# Patient Record
Sex: Female | Born: 1945 | Race: White | Hispanic: No | Smoking: Former smoker
Health system: Southern US, Community
[De-identification: ages and names within clinical notes are randomized; demographics above are authoritative.]

## PROBLEM LIST (undated history)

## (undated) DIAGNOSIS — F329 Major depressive disorder, single episode, unspecified: Secondary | ICD-10-CM

## (undated) DIAGNOSIS — J449 Chronic obstructive pulmonary disease, unspecified: Secondary | ICD-10-CM

## (undated) DIAGNOSIS — D649 Anemia, unspecified: Secondary | ICD-10-CM

## (undated) DIAGNOSIS — F32A Depression, unspecified: Secondary | ICD-10-CM

## (undated) DIAGNOSIS — L0390 Cellulitis, unspecified: Secondary | ICD-10-CM

## (undated) DIAGNOSIS — R768 Other specified abnormal immunological findings in serum: Secondary | ICD-10-CM

## (undated) DIAGNOSIS — G3184 Mild cognitive impairment, so stated: Secondary | ICD-10-CM

## (undated) DIAGNOSIS — I10 Essential (primary) hypertension: Secondary | ICD-10-CM

## (undated) DIAGNOSIS — R5383 Other fatigue: Secondary | ICD-10-CM

## (undated) DIAGNOSIS — F419 Anxiety disorder, unspecified: Secondary | ICD-10-CM

## (undated) DIAGNOSIS — K219 Gastro-esophageal reflux disease without esophagitis: Secondary | ICD-10-CM

## (undated) HISTORY — DX: Other specified abnormal immunological findings in serum: R76.8

## (undated) HISTORY — DX: Depression, unspecified: F32.A

## (undated) HISTORY — DX: Chronic obstructive pulmonary disease, unspecified: J44.9

## (undated) HISTORY — DX: Mild cognitive impairment of uncertain or unknown etiology: G31.84

## (undated) HISTORY — DX: Anxiety disorder, unspecified: F41.9

## (undated) HISTORY — DX: Other fatigue: R53.83

## (undated) HISTORY — DX: Anemia, unspecified: D64.9

## (undated) HISTORY — DX: Gastro-esophageal reflux disease without esophagitis: K21.9

## (undated) HISTORY — DX: Major depressive disorder, single episode, unspecified: F32.9

## (undated) HISTORY — DX: Cellulitis, unspecified: L03.90

## (undated) HISTORY — DX: Essential (primary) hypertension: I10

---

## 1998-10-14 ENCOUNTER — Emergency Department (HOSPITAL_COMMUNITY): Admission: EM | Admit: 1998-10-14 | Discharge: 1998-10-14 | Payer: Self-pay | Admitting: Emergency Medicine

## 2005-01-23 ENCOUNTER — Emergency Department: Payer: Self-pay | Admitting: Internal Medicine

## 2006-04-10 ENCOUNTER — Ambulatory Visit: Payer: Self-pay | Admitting: Otolaryngology

## 2012-05-10 ENCOUNTER — Emergency Department: Payer: Self-pay | Admitting: Emergency Medicine

## 2012-05-10 LAB — BASIC METABOLIC PANEL
Chloride: 109 mmol/L — ABNORMAL HIGH (ref 98–107)
Creatinine: 0.72 mg/dL (ref 0.60–1.30)
Osmolality: 283 (ref 275–301)
Potassium: 3.7 mmol/L (ref 3.5–5.1)

## 2012-05-10 LAB — CBC
MCH: 30 pg (ref 26.0–34.0)
MCHC: 34 g/dL (ref 32.0–36.0)
MCV: 88 fL (ref 80–100)
Platelet: 254 10*3/uL (ref 150–440)
RBC: 4.49 10*6/uL (ref 3.80–5.20)

## 2012-05-10 LAB — PRO B NATRIURETIC PEPTIDE: B-Type Natriuretic Peptide: 111 pg/mL (ref 0–125)

## 2012-05-10 LAB — TROPONIN I: Troponin-I: <0.02 ng/mL

## 2012-11-20 ENCOUNTER — Emergency Department: Payer: Self-pay | Admitting: Unknown Physician Specialty

## 2012-11-20 LAB — COMPREHENSIVE METABOLIC PANEL
Anion Gap: 8 (ref 7–16)
Bilirubin,Total: 0.8 mg/dL (ref 0.2–1.0)
Calcium, Total: 8.8 mg/dL (ref 8.5–10.1)
Chloride: 105 mmol/L (ref 98–107)
Creatinine: 0.81 mg/dL (ref 0.60–1.30)
EGFR (African American): >60
EGFR (Non-African Amer.): >60
Glucose: 92 mg/dL (ref 65–99)
Osmolality: 280 (ref 275–301)
SGPT (ALT): 10 U/L — ABNORMAL LOW (ref 12–78)
Sodium: 141 mmol/L (ref 136–145)
Total Protein: 7.2 g/dL (ref 6.4–8.2)

## 2012-11-20 LAB — CBC
HCT: 38.8 % (ref 35.0–47.0)
HGB: 13.4 g/dL (ref 12.0–16.0)
MCH: 30.4 pg (ref 26.0–34.0)
MCHC: 34.6 g/dL (ref 32.0–36.0)
MCV: 88 fL (ref 80–100)
Platelet: 243 10*3/uL (ref 150–440)
RBC: 4.42 10*6/uL (ref 3.80–5.20)

## 2012-11-20 LAB — CK TOTAL AND CKMB (NOT AT ARMC)
CK, Total: 63 U/L (ref 21–215)
CK-MB: <0.5 ng/mL — ABNORMAL LOW (ref 0.5–3.6)

## 2012-11-20 LAB — TROPONIN I: Troponin-I: <0.02 ng/mL

## 2013-02-11 ENCOUNTER — Emergency Department: Payer: Self-pay | Admitting: Emergency Medicine

## 2013-02-11 LAB — CBC
HCT: 39.7 % (ref 35.0–47.0)
HGB: 13.6 g/dL (ref 12.0–16.0)
MCH: 30.1 pg (ref 26.0–34.0)
MCHC: 34.3 g/dL (ref 32.0–36.0)
MCV: 88 fL (ref 80–100)
RBC: 4.53 10*6/uL (ref 3.80–5.20)
RDW: 13.5 % (ref 11.5–14.5)

## 2013-02-11 LAB — BASIC METABOLIC PANEL
Anion Gap: 9 (ref 7–16)
BUN: 15 mg/dL (ref 7–18)
Calcium, Total: 9 mg/dL (ref 8.5–10.1)
Chloride: 109 mmol/L — ABNORMAL HIGH (ref 98–107)
Co2: 24 mmol/L (ref 21–32)
Creatinine: 0.64 mg/dL (ref 0.60–1.30)
EGFR (Non-African Amer.): >60

## 2013-05-29 ENCOUNTER — Emergency Department: Payer: Self-pay | Admitting: Emergency Medicine

## 2013-05-29 LAB — BASIC METABOLIC PANEL
Anion Gap: 7 (ref 7–16)
BUN: 12 mg/dL (ref 7–18)
Calcium, Total: 9.3 mg/dL (ref 8.5–10.1)
Chloride: 108 mmol/L — ABNORMAL HIGH (ref 98–107)
Creatinine: 0.83 mg/dL (ref 0.60–1.30)
Glucose: 88 mg/dL (ref 65–99)

## 2013-05-29 LAB — CBC
HCT: 40.6 % (ref 35.0–47.0)
MCV: 87 fL (ref 80–100)
RDW: 13.7 % (ref 11.5–14.5)

## 2013-07-03 ENCOUNTER — Emergency Department: Payer: Self-pay | Admitting: Emergency Medicine

## 2013-07-03 LAB — COMPREHENSIVE METABOLIC PANEL
Albumin: 3.6 g/dL (ref 3.4–5.0)
BUN: 17 mg/dL (ref 7–18)
Bilirubin,Total: 0.5 mg/dL (ref 0.2–1.0)
Chloride: 108 mmol/L — ABNORMAL HIGH (ref 98–107)
Creatinine: 0.81 mg/dL (ref 0.60–1.30)
SGOT(AST): 19 U/L (ref 15–37)
SGPT (ALT): 13 U/L (ref 12–78)
Sodium: 142 mmol/L (ref 136–145)

## 2013-07-03 LAB — CK TOTAL AND CKMB (NOT AT ARMC): CK-MB: <0.5 ng/mL — ABNORMAL LOW (ref 0.5–3.6)

## 2013-07-03 LAB — CBC: RBC: 4.33 10*6/uL (ref 3.80–5.20)

## 2013-07-21 ENCOUNTER — Emergency Department: Payer: Self-pay | Admitting: Emergency Medicine

## 2013-07-21 LAB — BASIC METABOLIC PANEL
Anion Gap: 3 — ABNORMAL LOW (ref 7–16)
Calcium, Total: 9.3 mg/dL (ref 8.5–10.1)
Chloride: 106 mmol/L (ref 98–107)
Co2: 31 mmol/L (ref 21–32)
Creatinine: 0.71 mg/dL (ref 0.60–1.30)
EGFR (Non-African Amer.): >60
Potassium: 3.4 mmol/L — ABNORMAL LOW (ref 3.5–5.1)
Sodium: 140 mmol/L (ref 136–145)

## 2013-07-21 LAB — CBC
MCH: 29.8 pg (ref 26.0–34.0)
MCV: 86 fL (ref 80–100)
Platelet: 225 10*3/uL (ref 150–440)

## 2013-10-22 ENCOUNTER — Emergency Department: Payer: Self-pay | Admitting: Emergency Medicine

## 2013-11-27 ENCOUNTER — Emergency Department: Payer: Self-pay | Admitting: Emergency Medicine

## 2013-11-27 LAB — CBC WITH DIFFERENTIAL/PLATELET
Basophil #: 0.1 10*3/uL (ref 0.0–0.1)
Basophil %: 0.5 %
Eosinophil #: 0.2 10*3/uL (ref 0.0–0.7)
Eosinophil %: 1.8 %
HGB: 12.7 g/dL (ref 12.0–16.0)
Lymphocyte %: 11.1 %
MCH: 29.7 pg (ref 26.0–34.0)
Monocyte #: 1 x10 3/mm — ABNORMAL HIGH (ref 0.2–0.9)
Monocyte %: 8.1 %
Neutrophil #: 9.8 10*3/uL — ABNORMAL HIGH (ref 1.4–6.5)
Platelet: 252 10*3/uL (ref 150–440)

## 2013-11-27 LAB — COMPREHENSIVE METABOLIC PANEL
Anion Gap: 7 (ref 7–16)
Calcium, Total: 9.2 mg/dL (ref 8.5–10.1)
Osmolality: 278 (ref 275–301)
Potassium: 4 mmol/L (ref 3.5–5.1)
SGOT(AST): 28 U/L (ref 15–37)
SGPT (ALT): 22 U/L (ref 12–78)
Sodium: 139 mmol/L (ref 136–145)
Total Protein: 7 g/dL (ref 6.4–8.2)

## 2013-11-27 LAB — TROPONIN I: Troponin-I: <0.02 ng/mL

## 2013-12-09 ENCOUNTER — Emergency Department: Payer: Self-pay | Admitting: Emergency Medicine

## 2013-12-09 LAB — URINALYSIS, COMPLETE
Glucose,UR: NEGATIVE mg/dL (ref 0–75)
Ph: 8 (ref 4.5–8.0)
Protein: NEGATIVE
Specific Gravity: 1.014 (ref 1.003–1.030)
Squamous Epithelial: 5
WBC UR: 3 /HPF (ref 0–5)

## 2013-12-09 LAB — CK TOTAL AND CKMB (NOT AT ARMC)
CK, Total: 43 U/L (ref 21–215)
CK-MB: 0.5 ng/mL (ref 0.5–3.6)

## 2013-12-09 LAB — COMPREHENSIVE METABOLIC PANEL
Albumin: 3.7 g/dL (ref 3.4–5.0)
Alkaline Phosphatase: 94 U/L
Anion Gap: 6 — ABNORMAL LOW (ref 7–16)
BUN: 16 mg/dL (ref 7–18)
Bilirubin,Total: 1 mg/dL (ref 0.2–1.0)
Calcium, Total: 9 mg/dL (ref 8.5–10.1)
Chloride: 110 mmol/L — ABNORMAL HIGH (ref 98–107)
Co2: 25 mmol/L (ref 21–32)
Glucose: 89 mg/dL (ref 65–99)
Potassium: 3.2 mmol/L — ABNORMAL LOW (ref 3.5–5.1)
Total Protein: 6.8 g/dL (ref 6.4–8.2)

## 2013-12-09 LAB — CBC
MCHC: 32.9 g/dL (ref 32.0–36.0)
MCV: 88 fL (ref 80–100)
Platelet: 283 10*3/uL (ref 150–440)

## 2014-02-09 ENCOUNTER — Emergency Department: Payer: Self-pay | Admitting: Emergency Medicine

## 2014-02-09 LAB — CBC
HCT: 35.6 % (ref 35.0–47.0)
HGB: 11.9 g/dL — ABNORMAL LOW (ref 12.0–16.0)
MCH: 30.1 pg (ref 26.0–34.0)
MCHC: 33.4 g/dL (ref 32.0–36.0)
MCV: 90 fL (ref 80–100)
PLATELETS: 185 10*3/uL (ref 150–440)
RBC: 3.95 10*6/uL (ref 3.80–5.20)
RDW: 13.6 % (ref 11.5–14.5)
WBC: 5.6 10*3/uL (ref 3.6–11.0)

## 2014-02-09 LAB — BASIC METABOLIC PANEL
Anion Gap: 12 (ref 7–16)
BUN: 16 mg/dL (ref 7–18)
CALCIUM: 7.9 mg/dL — AB (ref 8.5–10.1)
CHLORIDE: 117 mmol/L — AB (ref 98–107)
Co2: 14 mmol/L — ABNORMAL LOW (ref 21–32)
Creatinine: 0.61 mg/dL (ref 0.60–1.30)
EGFR (African American): >60
EGFR (Non-African Amer.): >60
GLUCOSE: 82 mg/dL (ref 65–99)
Osmolality: 285 (ref 275–301)
POTASSIUM: 3.2 mmol/L — AB (ref 3.5–5.1)
SODIUM: 143 mmol/L (ref 136–145)

## 2014-02-09 LAB — TROPONIN I: Troponin-I: <0.02 ng/mL

## 2014-02-24 ENCOUNTER — Emergency Department: Payer: Self-pay | Admitting: Emergency Medicine

## 2014-02-24 LAB — CBC
HCT: 38.4 % (ref 35.0–47.0)
HGB: 12.2 g/dL (ref 12.0–16.0)
MCH: 28.5 pg (ref 26.0–34.0)
MCHC: 31.8 g/dL — ABNORMAL LOW (ref 32.0–36.0)
MCV: 90 fL (ref 80–100)
PLATELETS: 256 10*3/uL (ref 150–440)
RBC: 4.29 10*6/uL (ref 3.80–5.20)
RDW: 13.9 % (ref 11.5–14.5)
WBC: 8.5 10*3/uL (ref 3.6–11.0)

## 2014-02-24 LAB — COMPREHENSIVE METABOLIC PANEL
Albumin: 3.6 g/dL (ref 3.4–5.0)
Alkaline Phosphatase: 86 U/L
Anion Gap: 5 — ABNORMAL LOW (ref 7–16)
BUN: 10 mg/dL (ref 7–18)
Bilirubin,Total: 0.8 mg/dL (ref 0.2–1.0)
Calcium, Total: 8.6 mg/dL (ref 8.5–10.1)
Chloride: 107 mmol/L (ref 98–107)
Co2: 27 mmol/L (ref 21–32)
Creatinine: 0.69 mg/dL (ref 0.60–1.30)
EGFR (African American): >60
EGFR (Non-African Amer.): >60
Glucose: 95 mg/dL (ref 65–99)
OSMOLALITY: 276 (ref 275–301)
Potassium: 4 mmol/L (ref 3.5–5.1)
SGOT(AST): 15 U/L (ref 15–37)
SGPT (ALT): 15 U/L (ref 12–78)
SODIUM: 139 mmol/L (ref 136–145)
Total Protein: 6.5 g/dL (ref 6.4–8.2)

## 2014-02-24 LAB — LIPASE, BLOOD: LIPASE: 222 U/L (ref 73–393)

## 2014-02-24 LAB — TROPONIN I: Troponin-I: <0.02 ng/mL

## 2014-03-09 ENCOUNTER — Emergency Department: Payer: Self-pay | Admitting: Emergency Medicine

## 2014-04-08 ENCOUNTER — Emergency Department: Payer: Self-pay | Admitting: Internal Medicine

## 2014-04-08 LAB — COMPREHENSIVE METABOLIC PANEL
ALBUMIN: 3.8 g/dL (ref 3.4–5.0)
ANION GAP: 10 (ref 7–16)
Alkaline Phosphatase: 178 U/L — ABNORMAL HIGH
BUN: 12 mg/dL (ref 7–18)
Bilirubin,Total: 1.1 mg/dL — ABNORMAL HIGH (ref 0.2–1.0)
CALCIUM: 8.8 mg/dL (ref 8.5–10.1)
CHLORIDE: 105 mmol/L (ref 98–107)
CREATININE: 0.66 mg/dL (ref 0.60–1.30)
Co2: 24 mmol/L (ref 21–32)
EGFR (African American): >60
EGFR (Non-African Amer.): >60
GLUCOSE: 66 mg/dL (ref 65–99)
Osmolality: 275 (ref 275–301)
Potassium: 3.5 mmol/L (ref 3.5–5.1)
SGOT(AST): 28 U/L (ref 15–37)
SGPT (ALT): 36 U/L (ref 12–78)
Sodium: 139 mmol/L (ref 136–145)
Total Protein: 7 g/dL (ref 6.4–8.2)

## 2014-04-08 LAB — URINALYSIS, COMPLETE
Bilirubin,UR: NEGATIVE
Blood: NEGATIVE
GLUCOSE, UR: NEGATIVE mg/dL (ref 0–75)
Nitrite: POSITIVE
PROTEIN: NEGATIVE
Ph: 6 (ref 4.5–8.0)
RBC,UR: NONE SEEN /HPF (ref 0–5)
Specific Gravity: 1.011 (ref 1.003–1.030)

## 2014-04-08 LAB — CBC
HCT: 38.2 % (ref 35.0–47.0)
HGB: 12.4 g/dL (ref 12.0–16.0)
MCH: 28.2 pg (ref 26.0–34.0)
MCHC: 32.6 g/dL (ref 32.0–36.0)
MCV: 87 fL (ref 80–100)
PLATELETS: 371 10*3/uL (ref 150–440)
RBC: 4.4 10*6/uL (ref 3.80–5.20)
RDW: 14.9 % — AB (ref 11.5–14.5)
WBC: 8 10*3/uL (ref 3.6–11.0)

## 2014-04-08 LAB — TROPONIN I

## 2014-04-26 ENCOUNTER — Emergency Department: Payer: Self-pay | Admitting: Emergency Medicine

## 2014-04-26 LAB — URINALYSIS, COMPLETE
Bilirubin,UR: NEGATIVE
GLUCOSE, UR: NEGATIVE mg/dL (ref 0–75)
Ketone: NEGATIVE
NITRITE: NEGATIVE
PH: 7 (ref 4.5–8.0)
Protein: NEGATIVE
Specific Gravity: 1.002 (ref 1.003–1.030)
Squamous Epithelial: <1
WBC UR: 14 /HPF (ref 0–5)

## 2014-04-26 LAB — BASIC METABOLIC PANEL
Anion Gap: 6 — ABNORMAL LOW (ref 7–16)
BUN: 10 mg/dL (ref 7–18)
Calcium, Total: 8.6 mg/dL (ref 8.5–10.1)
Chloride: 111 mmol/L — ABNORMAL HIGH (ref 98–107)
Co2: 29 mmol/L (ref 21–32)
Creatinine: 0.8 mg/dL (ref 0.60–1.30)
EGFR (African American): >60
GLUCOSE: 94 mg/dL (ref 65–99)
Osmolality: 289 (ref 275–301)
Potassium: 3.3 mmol/L — ABNORMAL LOW (ref 3.5–5.1)
Sodium: 146 mmol/L — ABNORMAL HIGH (ref 136–145)

## 2014-04-26 LAB — CBC
HCT: 33.5 % — ABNORMAL LOW (ref 35.0–47.0)
HGB: 11.1 g/dL — AB (ref 12.0–16.0)
MCH: 28.9 pg (ref 26.0–34.0)
MCHC: 33.1 g/dL (ref 32.0–36.0)
MCV: 88 fL (ref 80–100)
Platelet: 282 10*3/uL (ref 150–440)
RBC: 3.83 10*6/uL (ref 3.80–5.20)
RDW: 15.7 % — ABNORMAL HIGH (ref 11.5–14.5)
WBC: 7 10*3/uL (ref 3.6–11.0)

## 2014-04-26 LAB — TROPONIN I: Troponin-I: <0.02 ng/mL

## 2014-06-23 ENCOUNTER — Ambulatory Visit: Payer: Commercial Managed Care - HMO | Admitting: Podiatry

## 2014-06-29 ENCOUNTER — Encounter: Payer: Self-pay | Admitting: Podiatry

## 2014-07-07 ENCOUNTER — Ambulatory Visit: Payer: Self-pay | Admitting: Family Medicine

## 2014-07-11 ENCOUNTER — Ambulatory Visit (INDEPENDENT_AMBULATORY_CARE_PROVIDER_SITE_OTHER): Payer: Commercial Managed Care - HMO

## 2014-07-11 ENCOUNTER — Ambulatory Visit (INDEPENDENT_AMBULATORY_CARE_PROVIDER_SITE_OTHER): Payer: Commercial Managed Care - HMO | Admitting: Podiatry

## 2014-07-11 ENCOUNTER — Encounter: Payer: Self-pay | Admitting: Podiatry

## 2014-07-11 VITALS — BP 136/84 | HR 80 | Resp 16 | Ht 62.0 in | Wt 112.0 lb

## 2014-07-11 DIAGNOSIS — M21611 Bunion of right foot: Secondary | ICD-10-CM

## 2014-07-11 DIAGNOSIS — M21619 Bunion of unspecified foot: Secondary | ICD-10-CM | POA: Diagnosis not present

## 2014-07-11 NOTE — Progress Notes (Signed)
   Subjective:    Patient ID: Crystal Velasquez, female    DOB: Oct 11, 1946, 68 y.o.   MRN: [id number]  HPI Comments: Have this right foot looked at, the toe is curving in. Not having any pain with it.     Review of Systems  All other systems reviewed and are negative.      Objective:   Physical Exam        Assessment & Plan:

## 2014-07-11 NOTE — Progress Notes (Signed)
Subjective:     Patient ID: Crystal Velasquez, female   DOB: [birth year], 68 y.o.   MRN: [id number]  HPI patient presents stating she has a painful bunion on her right foot and the big toe continues to move over further and makes it difficult to wear shoe gear comfortably. Patient states that this is been an ongoing issue   Review of Systems  All other systems reviewed and are negative.      Objective:   Physical Exam  Nursing note and vitals reviewed. Cardiovascular: Intact distal pulses.   Musculoskeletal: Normal range of motion.  Skin: Skin is warm and dry.   neurovascular status found to be intact with muscle strength adequate and range of motion of the subtalar and midtarsal joint within normal limits. Patient is found to have large hyperostosis medial aspect first metatarsal head right that is red and painful with deviation of the hallux against the second toe with no pain in the big toe itself. 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 I do think that we can do a less aggressive procedure and give her at least relief of her symptoms and I'm concerned about a more aggressive procedure due to her living alone and having difficulty being nonweightbearing. I do think and Eliberto Ivoryustin bunionectomy will give her enough correction to allow for a better alignment of her foot and prevent problems in the future and she wants to have this done and will come back for consult in 1 week

## 2014-07-22 ENCOUNTER — Emergency Department: Payer: Self-pay | Admitting: Emergency Medicine

## 2014-07-22 LAB — CBC
HCT: 36.5 % (ref 35.0–47.0)
HGB: 12.2 g/dL (ref 12.0–16.0)
MCH: 29.7 pg (ref 26.0–34.0)
MCHC: 33.4 g/dL (ref 32.0–36.0)
MCV: 89 fL (ref 80–100)
PLATELETS: 244 10*3/uL (ref 150–440)
RBC: 4.1 10*6/uL (ref 3.80–5.20)
RDW: 13.6 % (ref 11.5–14.5)
WBC: 7.3 10*3/uL (ref 3.6–11.0)

## 2014-07-22 LAB — BASIC METABOLIC PANEL
Anion Gap: 7 (ref 7–16)
BUN: 12 mg/dL (ref 7–18)
CREATININE: 0.82 mg/dL (ref 0.60–1.30)
Calcium, Total: 8.5 mg/dL (ref 8.5–10.1)
Chloride: 109 mmol/L — ABNORMAL HIGH (ref 98–107)
Co2: 27 mmol/L (ref 21–32)
EGFR (Non-African Amer.): >60
GLUCOSE: 94 mg/dL (ref 65–99)
OSMOLALITY: 284 (ref 275–301)
POTASSIUM: 3.6 mmol/L (ref 3.5–5.1)
Sodium: 143 mmol/L (ref 136–145)

## 2014-07-22 LAB — LIPASE, BLOOD: LIPASE: 281 U/L (ref 73–393)

## 2014-07-22 LAB — TROPONIN I

## 2014-07-22 LAB — HEPATIC FUNCTION PANEL A (ARMC)
ALK PHOS: 65 U/L
ALT: 15 U/L
Albumin: 4 g/dL (ref 3.4–5.0)
Bilirubin,Total: 1.9 mg/dL — ABNORMAL HIGH (ref 0.2–1.0)
SGOT(AST): 17 U/L (ref 15–37)
Total Protein: 6.8 g/dL (ref 6.4–8.2)

## 2014-07-25 ENCOUNTER — Ambulatory Visit (INDEPENDENT_AMBULATORY_CARE_PROVIDER_SITE_OTHER): Payer: Commercial Managed Care - HMO | Admitting: Podiatry

## 2014-07-25 VITALS — BP 136/81 | HR 79 | Resp 16

## 2014-07-25 DIAGNOSIS — M21611 Bunion of right foot: Secondary | ICD-10-CM

## 2014-07-25 DIAGNOSIS — M21619 Bunion of unspecified foot: Secondary | ICD-10-CM

## 2014-07-25 NOTE — Progress Notes (Signed)
Subjective:     Patient ID: Crystal Velasquez, female   DOB: 02-05-1946, 68 y.o.   MRN: [id number]  HPI patient presents stating I'm here to go over the forms to have my bunion fixed. States it's been sore and she cannot wear shoe gear  Review of Systems     Objective:   Physical Exam Neurovascular status intact with muscle strength adequate and range of motion of the subtalar and midtarsal joint within normal limits. 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 Patient wants the surgery and at this time she read consent form going over all possible complications and signed after review understanding total recovery. Take 6 months to one year

## 2014-07-26 ENCOUNTER — Encounter: Payer: Self-pay | Admitting: *Deleted

## 2014-07-26 NOTE — Progress Notes (Signed)
Myriam Forehandalled humana - ref # [id number]. precert not required for surgery, proc code [id number]. Id # [id number] stating no precert required.

## 2014-08-03 ENCOUNTER — Telehealth: Payer: Self-pay

## 2014-08-03 NOTE — Telephone Encounter (Signed)
Tried to call pt regarding post operative status. No vm available

## 2014-08-08 ENCOUNTER — Encounter: Payer: Self-pay | Admitting: Podiatry

## 2014-08-09 ENCOUNTER — Telehealth: Payer: Self-pay

## 2014-08-09 NOTE — Telephone Encounter (Signed)
Tried to call pt and inquire about post operative status, the number available is no longer taking calls

## 2014-08-15 ENCOUNTER — Encounter: Payer: Commercial Managed Care - HMO | Admitting: Podiatry

## 2014-08-16 ENCOUNTER — Encounter: Payer: Self-pay | Admitting: *Deleted

## 2014-08-16 NOTE — Progress Notes (Signed)
Per dr Charlsie Merles, pt n/s for surgery sch for 8.18.15. pts # is disconnected.

## 2014-08-17 ENCOUNTER — Emergency Department: Payer: Self-pay | Admitting: Emergency Medicine

## 2014-08-17 LAB — CBC
HCT: 36.4 % (ref 35.0–47.0)
HGB: 11.8 g/dL — ABNORMAL LOW (ref 12.0–16.0)
MCH: 28.7 pg (ref 26.0–34.0)
MCHC: 32.3 g/dL (ref 32.0–36.0)
MCV: 89 fL (ref 80–100)
PLATELETS: 229 10*3/uL (ref 150–440)
RBC: 4.1 10*6/uL (ref 3.80–5.20)
RDW: 13.7 % (ref 11.5–14.5)
WBC: 7.8 10*3/uL (ref 3.6–11.0)

## 2014-08-17 LAB — BASIC METABOLIC PANEL
Anion Gap: 9 (ref 7–16)
BUN: 9 mg/dL (ref 7–18)
CALCIUM: 9.1 mg/dL (ref 8.5–10.1)
CO2: 26 mmol/L (ref 21–32)
Chloride: 108 mmol/L — ABNORMAL HIGH (ref 98–107)
Creatinine: 0.8 mg/dL (ref 0.60–1.30)
GLUCOSE: 161 mg/dL — AB (ref 65–99)
Osmolality: 287 (ref 275–301)
Potassium: 2.9 mmol/L — ABNORMAL LOW (ref 3.5–5.1)
SODIUM: 143 mmol/L (ref 136–145)

## 2014-08-17 LAB — TROPONIN I: Troponin-I: <0.02 ng/mL

## 2014-08-25 ENCOUNTER — Emergency Department: Payer: Self-pay | Admitting: Emergency Medicine

## 2014-08-25 LAB — CBC
HCT: 35.6 % (ref 35.0–47.0)
HGB: 11.6 g/dL — ABNORMAL LOW (ref 12.0–16.0)
MCH: 28.7 pg (ref 26.0–34.0)
MCHC: 32.6 g/dL (ref 32.0–36.0)
MCV: 88 fL (ref 80–100)
PLATELETS: 247 10*3/uL (ref 150–440)
RBC: 4.04 10*6/uL (ref 3.80–5.20)
RDW: 14.2 % (ref 11.5–14.5)
WBC: 5.5 10*3/uL (ref 3.6–11.0)

## 2014-08-25 LAB — URINALYSIS, COMPLETE
BILIRUBIN, UR: NEGATIVE
Bacteria: NONE SEEN
Blood: NEGATIVE
Glucose,UR: 150 mg/dL (ref 0–75)
KETONE: NEGATIVE
Leukocyte Esterase: NEGATIVE
Nitrite: NEGATIVE
Ph: 7 (ref 4.5–8.0)
Protein: NEGATIVE
SPECIFIC GRAVITY: 1.01 (ref 1.003–1.030)

## 2014-08-25 LAB — COMPREHENSIVE METABOLIC PANEL
ALT: 12 U/L — AB
ANION GAP: 8 (ref 7–16)
AST: 8 U/L — AB (ref 15–37)
Albumin: 3.7 g/dL (ref 3.4–5.0)
Alkaline Phosphatase: 68 U/L
BILIRUBIN TOTAL: 0.4 mg/dL (ref 0.2–1.0)
BUN: 13 mg/dL (ref 7–18)
CALCIUM: 8.8 mg/dL (ref 8.5–10.1)
CHLORIDE: 108 mmol/L — AB (ref 98–107)
CREATININE: 0.78 mg/dL (ref 0.60–1.30)
Co2: 24 mmol/L (ref 21–32)
EGFR (Non-African Amer.): >60
GLUCOSE: 149 mg/dL — AB (ref 65–99)
OSMOLALITY: 282 (ref 275–301)
Potassium: 3.5 mmol/L (ref 3.5–5.1)
Sodium: 140 mmol/L (ref 136–145)
TOTAL PROTEIN: 6.5 g/dL (ref 6.4–8.2)

## 2014-08-25 LAB — LIPASE, BLOOD: Lipase: 170 U/L (ref 73–393)

## 2014-08-25 LAB — TROPONIN I: Troponin-I: <0.02 ng/mL

## 2014-08-27 ENCOUNTER — Emergency Department: Payer: Self-pay | Admitting: Emergency Medicine

## 2014-08-27 LAB — CBC
HCT: 38.5 % (ref 35.0–47.0)
HGB: 12.8 g/dL (ref 12.0–16.0)
MCH: 29 pg (ref 26.0–34.0)
MCHC: 33.3 g/dL (ref 32.0–36.0)
MCV: 87 fL (ref 80–100)
PLATELETS: 274 10*3/uL (ref 150–440)
RBC: 4.41 10*6/uL (ref 3.80–5.20)
RDW: 14 % (ref 11.5–14.5)
WBC: 8.5 10*3/uL (ref 3.6–11.0)

## 2014-08-27 LAB — CK TOTAL AND CKMB (NOT AT ARMC)
CK, TOTAL: 36 U/L
CK-MB: 0.6 ng/mL (ref 0.5–3.6)

## 2014-08-27 LAB — COMPREHENSIVE METABOLIC PANEL
ALBUMIN: 3.6 g/dL (ref 3.4–5.0)
Alkaline Phosphatase: 65 U/L
Anion Gap: 9 (ref 7–16)
BUN: 10 mg/dL (ref 7–18)
Bilirubin,Total: 0.5 mg/dL (ref 0.2–1.0)
CALCIUM: 8.9 mg/dL (ref 8.5–10.1)
Chloride: 108 mmol/L — ABNORMAL HIGH (ref 98–107)
Co2: 26 mmol/L (ref 21–32)
Creatinine: 0.82 mg/dL (ref 0.60–1.30)
EGFR (Non-African Amer.): >60
Glucose: 91 mg/dL (ref 65–99)
Osmolality: 284 (ref 275–301)
Potassium: 3 mmol/L — ABNORMAL LOW (ref 3.5–5.1)
SGOT(AST): 19 U/L (ref 15–37)
SGPT (ALT): 14 U/L
Sodium: 143 mmol/L (ref 136–145)
Total Protein: 6.7 g/dL (ref 6.4–8.2)

## 2014-08-27 LAB — TROPONIN I

## 2014-08-28 ENCOUNTER — Emergency Department: Payer: Self-pay | Admitting: Emergency Medicine

## 2015-04-14 NOTE — Consult Note (Signed)
PATIENT NAME:  Crystal Velasquez, Crystal Velasquez MR#:  [id number] DATE OF BIRTH:  09-23-1946  DATE OF CONSULTATION:  04/08/2014  REFERRING PHYSICIAN:   CONSULTING PHYSICIAN:  Nychelle Cassata K. Isack Lavalley, MD  AGE:  69 years.  SEX:  Female.  RACE:  White.  SUBJECTIVE:  The patient was seen in consultation in room #20A. The patient is a 69 year old white female, retired after working for many years. The patient is single and has been divorced for many years and lives by herself in a trailer. The patient to [facility] after taking a handful of pills in a suicidal gesture or thought. However, the patient reports that she came here for chief complaint of "breathing problems."   PAST PSYCHIATRIC HISTORY:  No previous history of inpatient psychiatry. No history of suicide nor being followed by a psychiatrist.  ALCOHOL AND DRUGS:  Denies drinking alcohol. Denies street or prescription drug abuse. Used to smoke nicotine cigarettes but has been trying to quit the same.  MENTAL STATUS:  The patient is alert and oriented, calm, pleasant and cooperative. No agitation. Affect is appropriate with the mood, which is low and down as she feels low and down because she lives by herself and she has a son who lives in [place] and has his own family and she has a granddaughter that lives in [place], and it is hard for them to visit because of the distance and having their own problems and families. She reports she had suicidal wishes when she came in, but currently she contracts for safety and she is eager to go home as she has to take care of her dog and take care of several things at home. No psychosis. Denies auditory or visual hallucinations. Cognition intact. General knowledge of information fair. Denies suicidal or homicidal plans and contracts for safety. Eager to go home and get help on an outpatient basis for her depression and she realizes that she can get help.  IMPRESSION:  Major depression. Recurrent eith  depression.  RECOMMENDATIONS:  Discharge patient as she contracts for safety. The patient has information to get outpatient help and she has a way to contact [facility] on Monday morning, that is 04/10/2014, and she will get help from a psychiatrist and a therapist. ____________________________ Jannet MantisSurya K. Guss [place]unde[place], MD skc:dr D: 04/08/2014 20:45:19 ET T: 04/09/2014 01:47:54 ET JOB#: [id number]  cc: Monika SalkSurya K. Guss [place], MD, <Dictator> Beau FannySURYA K Hatem Cull MD ELECTRONICALLY SIGNED 04/09/2014 18:53

## 2015-08-12 IMAGING — CR DG CHEST 1V PORT
1 series · 1 of 1 positions shown · non-contrast
Comparison: 03/09/2014; 02/09/2014; 11/27/2013; chest CT -
11/27/2013

CLINICAL DATA: Increased shortness of breath, history of anxiety
and asthma

EXAM:
PORTABLE CHEST - 1 VIEW

[ap]
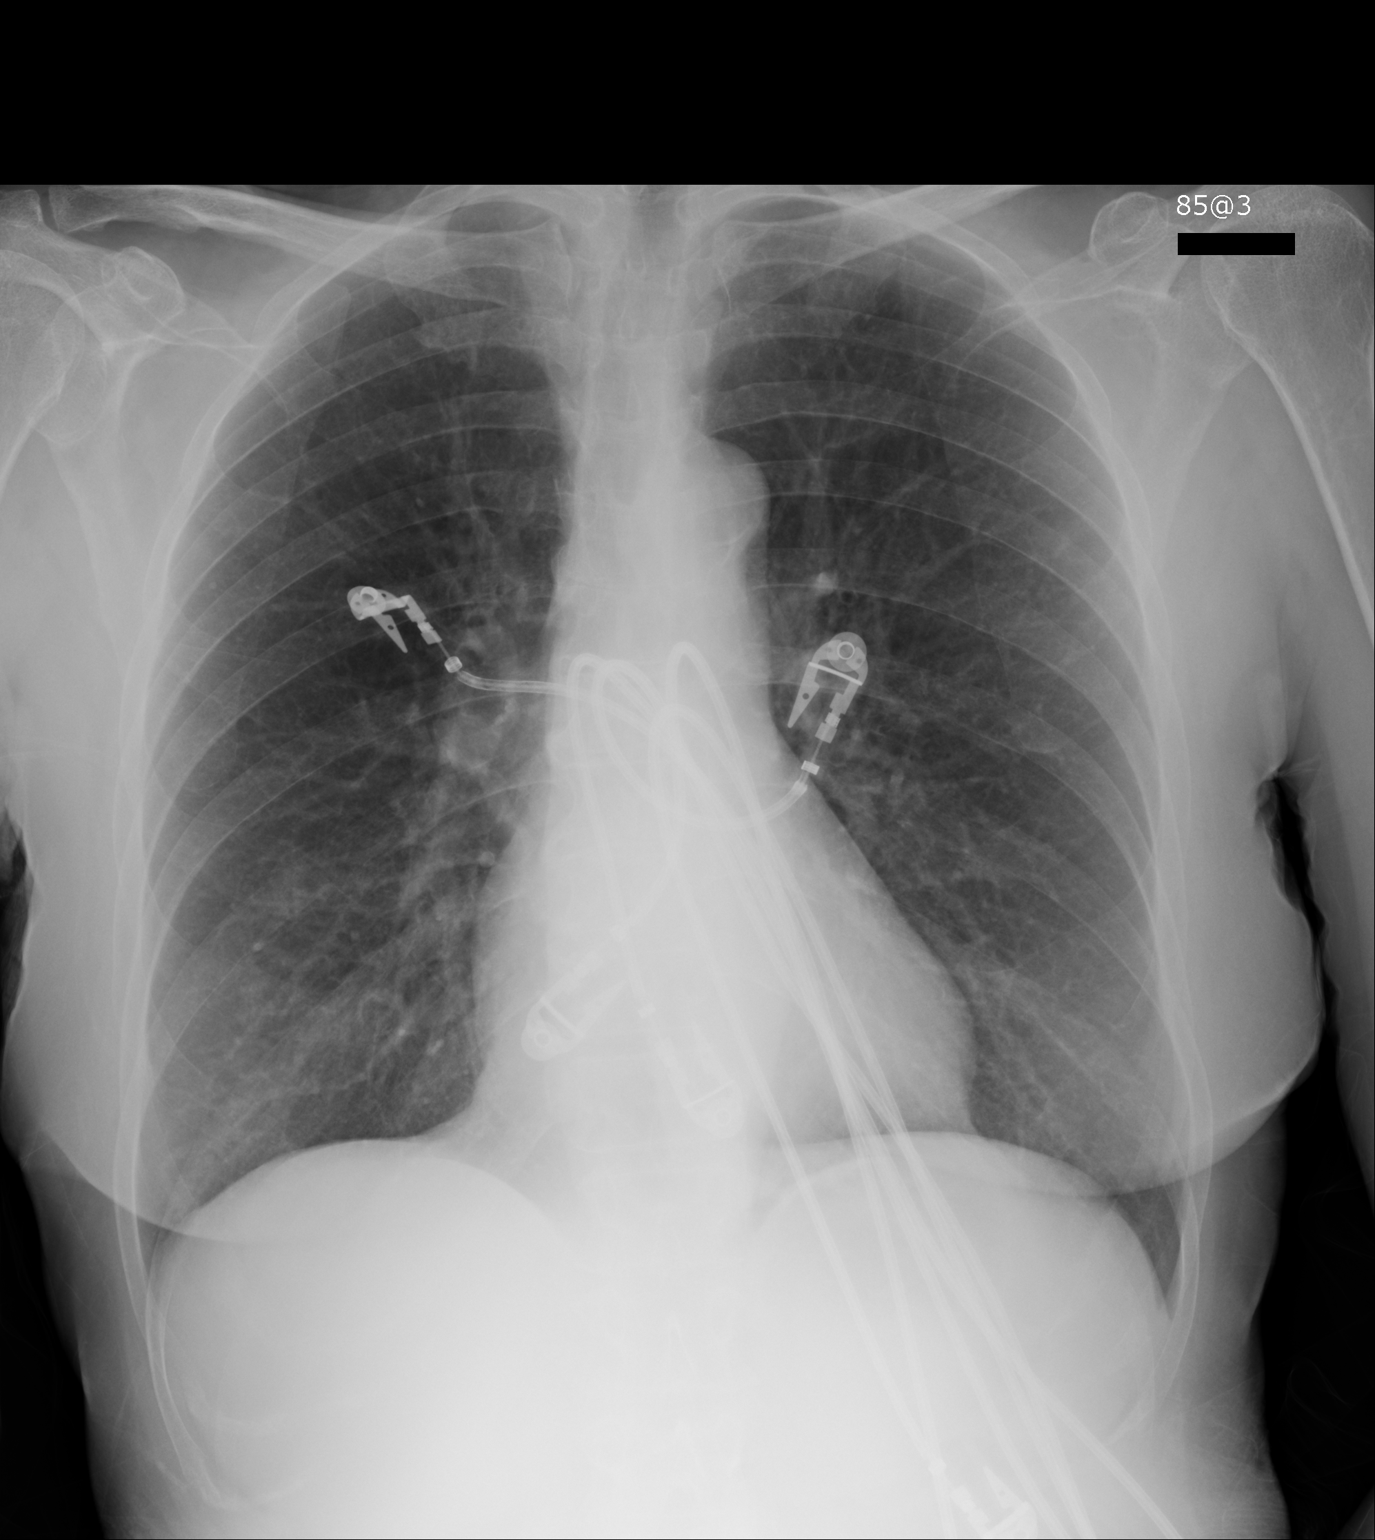

[1 of 1 positions shown; findings below may reference images not displayed]

FINDINGS: Grossly unchanged cardiac silhouette and mediastinal contours. The
lungs appear hyperexpanded with mild diffuse slightly nodular
thickening of the pulmonary interstitium. No focal airspace
opacities. No pleural effusion or pneumothorax. No evidence of
edema. No acute osseus abnormalities.
IMPRESSION: Hyperexpanded lungs and bronchitic change without acute
cardiopulmonary disease.

## 2015-08-30 IMAGING — CR DG CHEST 1V PORT
1 series · 1 of 1 positions shown · non-contrast
Comparison: 04/08/2014

CLINICAL DATA: Shortness of breath.  Asthma.

EXAM:
PORTABLE CHEST - 1 VIEW

[ap]
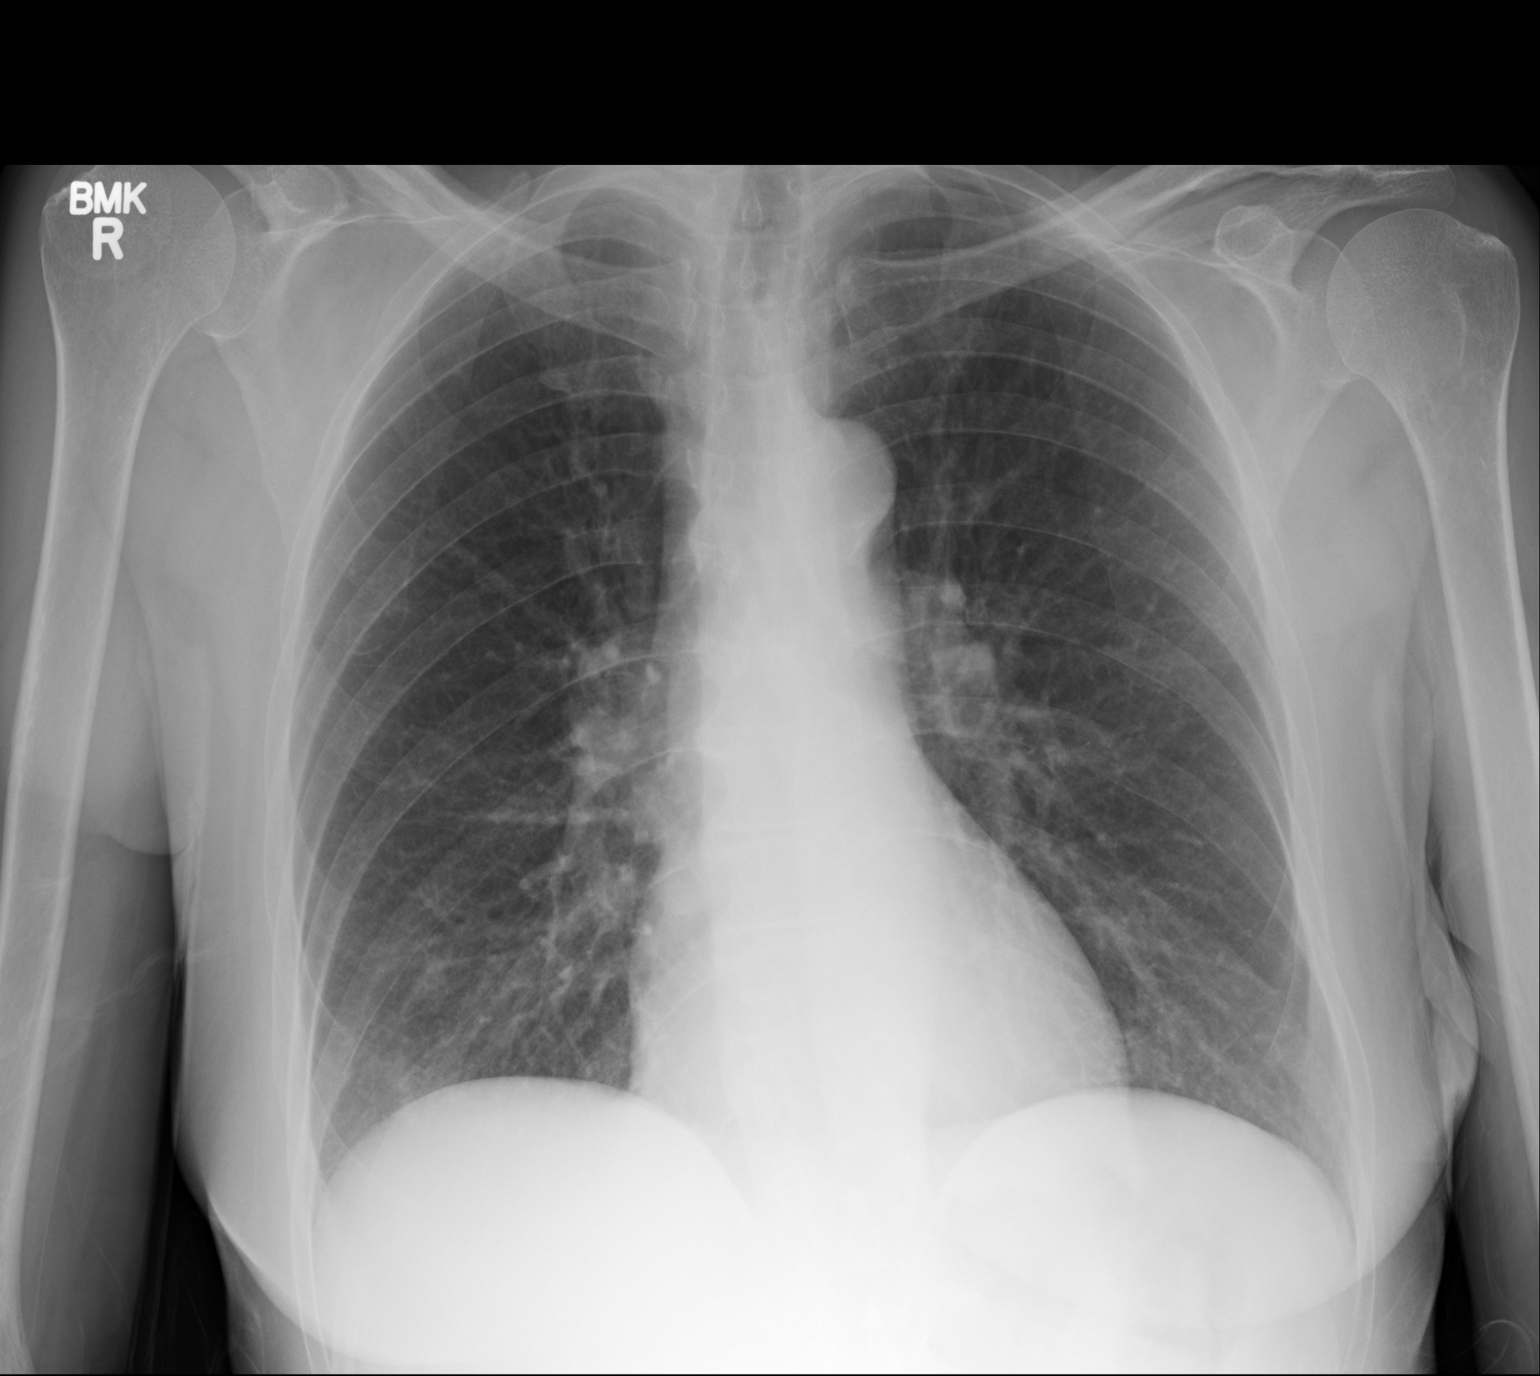

[1 of 1 positions shown; findings below may reference images not displayed]

FINDINGS: Mild pulmonary hyperinflation which may reflect chronic air
trapping. Normal heart size and mediastinal contours. Subtle
haziness at the bases is unchanged from prior. No effusion, edema,
or consolidation. No pneumothorax.
IMPRESSION: Stable exam.  No evidence of acute cardiopulmonary disease.

## 2015-12-21 IMAGING — CR DG CHEST 2V
1 series · 2 of 2 positions shown · non-contrast
Comparison: 07/22/2014.

CLINICAL DATA: Shortness of breath.  Asthma.

EXAM:
CHEST  2 VIEW

[Series 1: dxr chest pa (or ap) and lateral · 0.14mm/px · 2 of 2 slices shown]
[im 1/2]
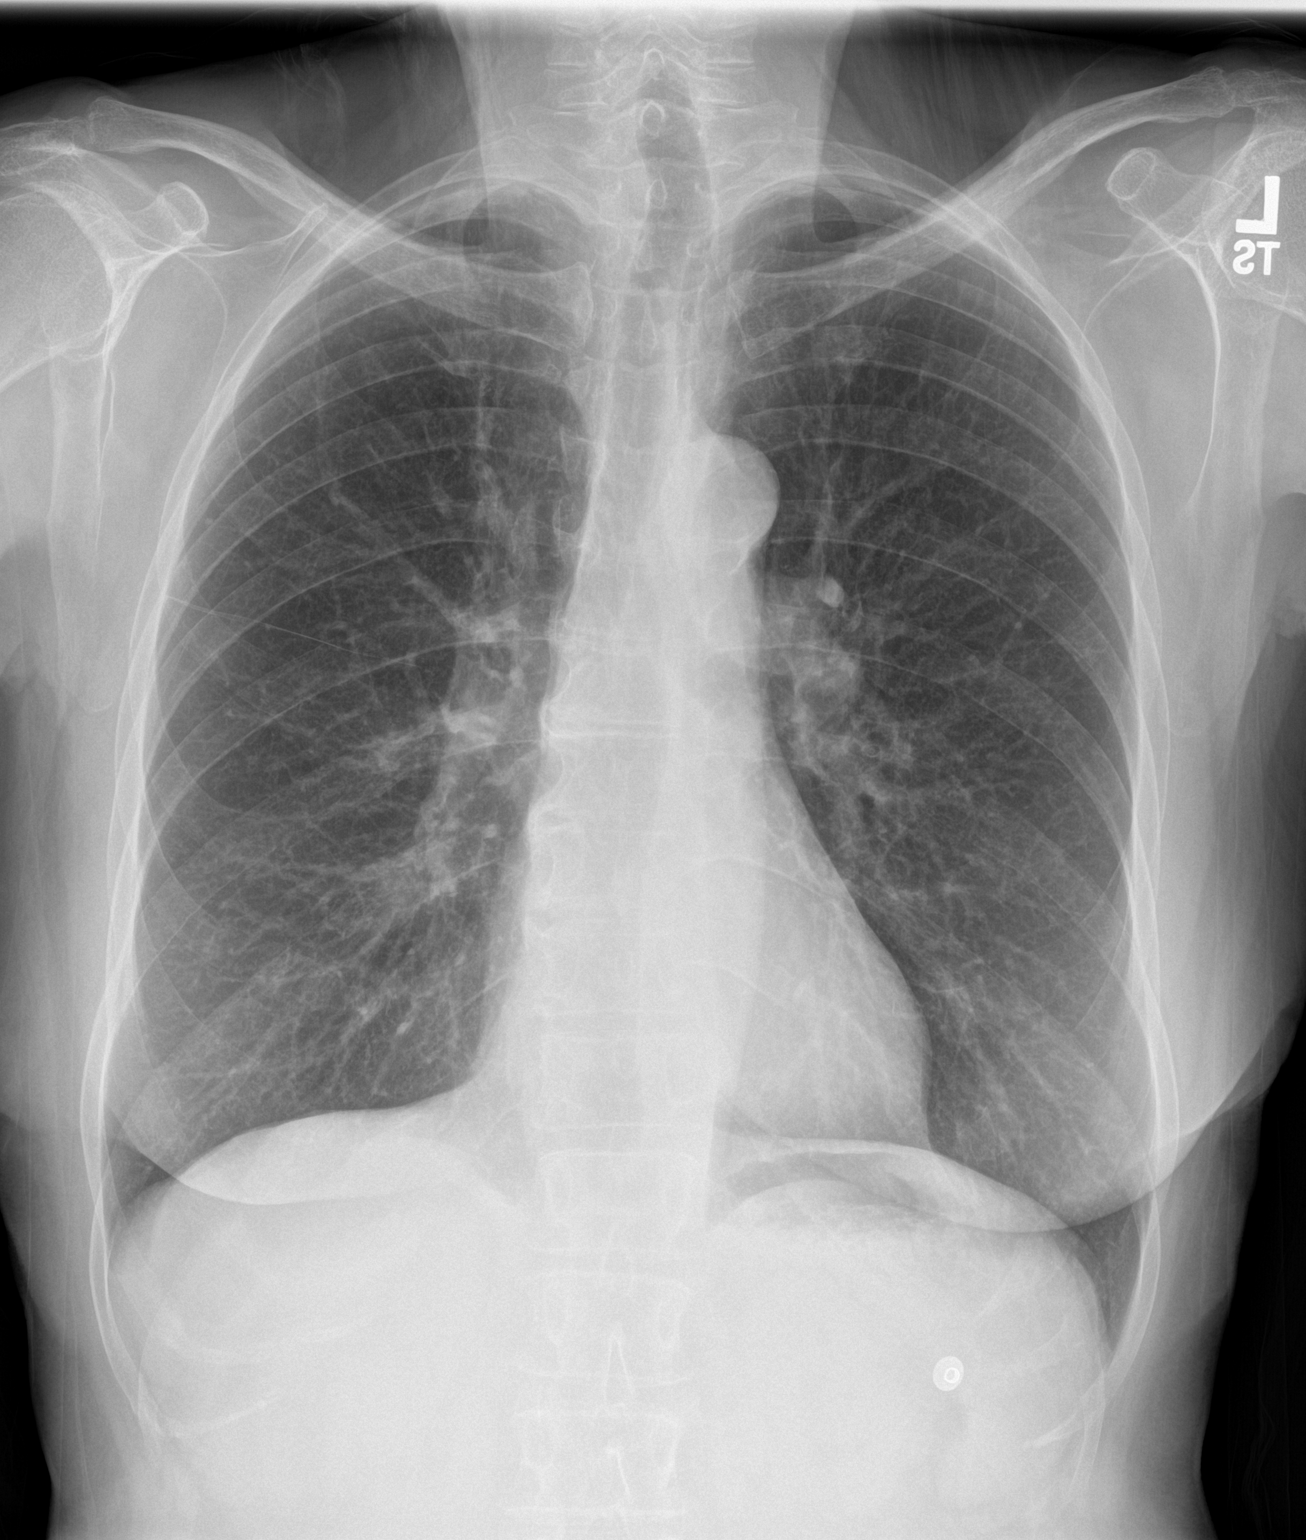
[im 2/2]
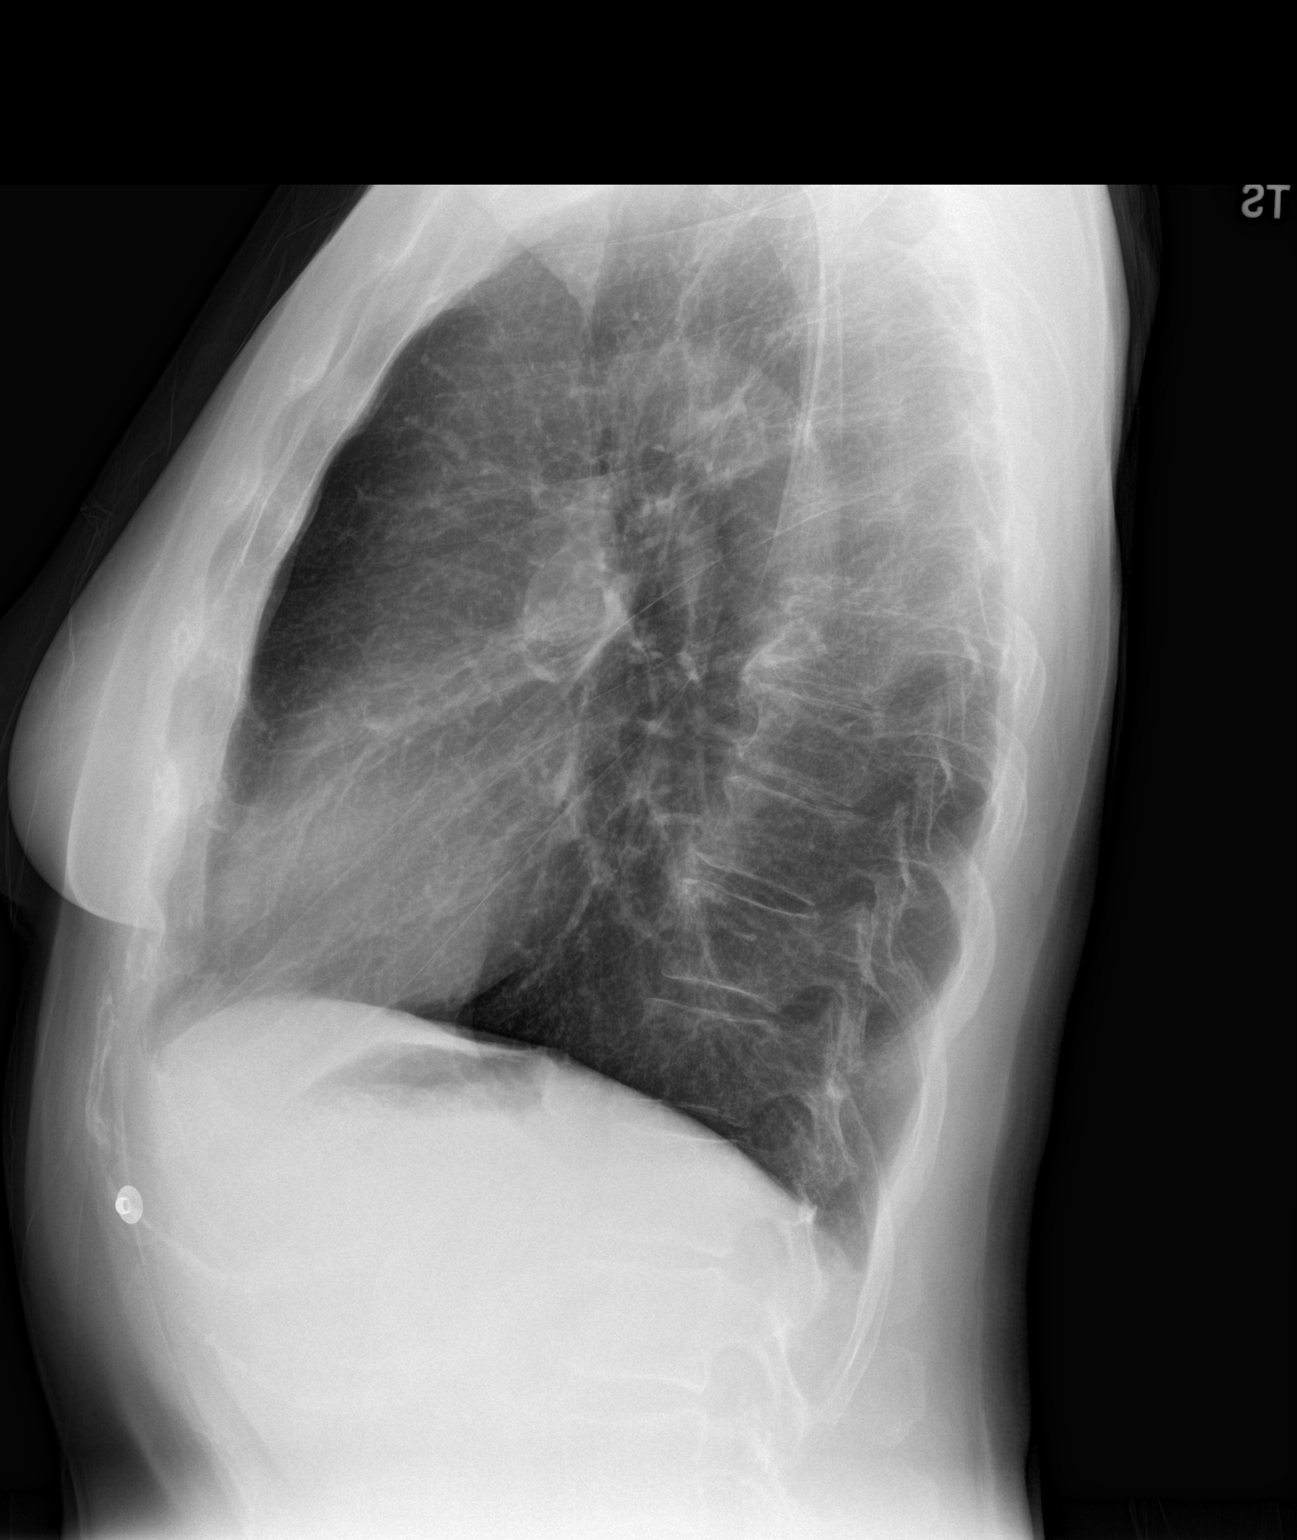

[2 of 2 positions shown; findings below may reference images not displayed]

FINDINGS: Normal sized heart. Clear lungs. The lungs remain hyperexpanded with
stable mildly prominent interstitial markings. Diffuse osteopenia.
Thoracic spine degenerative changes.
IMPRESSION: No acute abnormality.  Stable changes of COPD.

## 2016-03-22 DEATH — deceased
